# Patient Record
Sex: Male | Born: 1970 | Race: White | Hispanic: No | Marital: Single
Health system: Southern US, Community
[De-identification: ages and names within clinical notes are randomized; demographics above are authoritative.]

## PROBLEM LIST (undated history)

## (undated) DIAGNOSIS — E119 Type 2 diabetes mellitus without complications: Secondary | ICD-10-CM

---

## 2020-09-17 ENCOUNTER — Encounter (HOSPITAL_COMMUNITY): Payer: Self-pay | Admitting: Emergency Medicine

## 2020-09-17 ENCOUNTER — Emergency Department (HOSPITAL_COMMUNITY): Payer: PRIVATE HEALTH INSURANCE

## 2020-09-17 ENCOUNTER — Emergency Department (HOSPITAL_COMMUNITY)
Admission: EM | Admit: 2020-09-17 | Discharge: 2020-09-17 | Disposition: A | Discharge status: Home / Self Care | Payer: PRIVATE HEALTH INSURANCE | Attending: Emergency Medicine | Admitting: Emergency Medicine

## 2020-09-17 ENCOUNTER — Other Ambulatory Visit: Payer: Self-pay

## 2020-09-17 DIAGNOSIS — R000 Tachycardia, unspecified: Secondary | ICD-10-CM | POA: Insufficient documentation

## 2020-09-17 DIAGNOSIS — R600 Localized edema: Secondary | ICD-10-CM | POA: Insufficient documentation

## 2020-09-17 DIAGNOSIS — R0602 Shortness of breath: Secondary | ICD-10-CM | POA: Insufficient documentation

## 2020-09-17 DIAGNOSIS — E119 Type 2 diabetes mellitus without complications: Secondary | ICD-10-CM | POA: Diagnosis not present

## 2020-09-17 DIAGNOSIS — M25512 Pain in left shoulder: Secondary | ICD-10-CM | POA: Insufficient documentation

## 2020-09-17 HISTORY — DX: Type 2 diabetes mellitus without complications: E11.9

## 2020-09-17 LAB — D-DIMER, QUANTITATIVE: D-Dimer, Quant: <0.27 ug/mL-FEU (ref 0.00–0.50)

## 2020-09-17 LAB — CBC
HCT: 43.8 % (ref 39.0–52.0)
Hemoglobin: 15.2 g/dL (ref 13.0–17.0)
MCH: 31.7 pg (ref 26.0–34.0)
MCHC: 34.7 g/dL (ref 30.0–36.0)
MCV: 91.4 fL (ref 80.0–100.0)
Platelets: 225 10*3/uL (ref 150–400)
RBC: 4.79 MIL/uL (ref 4.22–5.81)
RDW: 12.2 % (ref 11.5–15.5)
WBC: 8.9 10*3/uL (ref 4.0–10.5)
nRBC: 0 % (ref 0.0–0.2)

## 2020-09-17 LAB — COMPREHENSIVE METABOLIC PANEL
ALT: 52 U/L — ABNORMAL HIGH (ref 0–44)
AST: 47 U/L — ABNORMAL HIGH (ref 15–41)
Albumin: 4 g/dL (ref 3.5–5.0)
Alkaline Phosphatase: 51 U/L (ref 38–126)
Anion gap: 8 (ref 5–15)
BUN: 15 mg/dL (ref 6–20)
CO2: 27 mmol/L (ref 22–32)
Calcium: 8.8 mg/dL — ABNORMAL LOW (ref 8.9–10.3)
Chloride: 99 mmol/L (ref 98–111)
Creatinine, Ser: 1.17 mg/dL (ref 0.61–1.24)
GFR, Estimated: >60 mL/min (ref >60)
Glucose, Bld: 150 mg/dL — ABNORMAL HIGH (ref 70–99)
Potassium: 3.6 mmol/L (ref 3.5–5.1)
Sodium: 134 mmol/L — ABNORMAL LOW (ref 135–145)
Total Bilirubin: 1.1 mg/dL (ref 0.3–1.2)
Total Protein: 7.9 g/dL (ref 6.5–8.1)

## 2020-09-17 LAB — BRAIN NATRIURETIC PEPTIDE: B Natriuretic Peptide: 11.4 pg/mL (ref 0.0–100.0)

## 2020-09-17 LAB — TROPONIN I (HIGH SENSITIVITY)
Troponin I (High Sensitivity): 3 ng/L (ref <18)
Troponin I (High Sensitivity): 2 ng/L (ref <18)

## 2020-09-17 NOTE — Discharge Instructions (Addendum)
Please see your doctor as soon as you are back home.

## 2020-09-17 NOTE — ED Notes (Signed)
X-ray at bedside

## 2020-09-17 NOTE — ED Triage Notes (Addendum)
Patient reports SOB, dizziness, and left shoulder/armpit pain which started approximately 3 hours ago. He also reports bilateral edema which he noticed is worse than usual and has not improved with lasix. He states he feels that he cannot catch his breath. He denies any sick exposures. Hx asthma, reports using rescue inhaler today (2 puffs) w/ no relief. Reports SOB worsens w/ exertion.

## 2020-09-17 NOTE — ED Provider Notes (Signed)
[facility]-EMERGENCY DEPT Provider Note   CSN: [id number] Arrival date & time: 09/17/20  1652     History Chief Complaint  Patient presents with  . Shortness of Breath    Gregory Douglas is a 50 y.o. male.  The history is provided by the patient.  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 No pertinent past medical history.  There are no problems to display for this patient.  History reviewed. No pertinent family history.     Home Medications Prior to Admission medications   Not on File    Allergies    Patient has no allergy information on record.  Review of Systems   Review of Systems  Constitutional: Negative for chills and fever.  HENT: Negative for ear pain and sore throat.   Eyes: Negative for pain and visual disturbance.  Respiratory: Positive for shortness of breath. Negative for cough and sputum production.   Cardiovascular: Negative for chest pain (Has had some deep left shoulder pain though.) and palpitations.  Gastrointestinal: Negative for abdominal pain and vomiting.  Genitourinary: Negative for dysuria and hematuria.  Musculoskeletal: Negative for arthralgias and back pain.  Skin: Negative for color change and rash.  Neurological: Negative for seizures and syncope.  All other systems reviewed and are negative.   Physical Exam Updated Vital Signs BP (!) 166/91   Pulse (!) 101   Temp 98.1 F (36.7 C)  (Oral)   Resp (!) 21   Ht 5\' 7"  (1.702 m)   Wt (!) 172.4 kg   SpO2 95%   BMI 59.52 kg/m   Physical Exam Vitals and nursing note reviewed.  Constitutional:      Appearance: He is well-developed.  HENT:     Head: Normocephalic and atraumatic.  Eyes:     Conjunctiva/sclera: Conjunctivae normal.  Cardiovascular:     Rate and Rhythm: Regular rhythm. Tachycardia present.     Heart sounds: No murmur heard.   Pulmonary:     Effort: Pulmonary effort is normal. No respiratory distress.     Breath sounds: Examination of the right-upper field reveals decreased breath sounds. Examination of the left-upper field reveals decreased breath sounds. Examination of the right-middle field reveals decreased breath sounds. Examination of the left-middle field reveals decreased breath sounds. Examination of the right-lower field reveals decreased breath sounds. Examination of the left-lower field reveals decreased breath sounds. Decreased breath sounds present.  Abdominal:     Palpations: Abdomen is soft.     Tenderness: There is no abdominal tenderness.  Musculoskeletal:     Cervical back: Neck supple.     Right lower leg: Edema present.     Left lower leg: Edema present.     Comments: Edema to the ankles bilaterally Hyperpigmented area at left ankle from prior skin infection  Skin:    General: Skin is warm and dry.     Capillary Refill: Capillary refill takes less than  2 seconds.  Neurological:     General: No focal deficit present.     Mental Status: He is alert.  Psychiatric:        Mood and Affect: Mood normal.        Behavior: Behavior normal.     ED Results / Procedures / Treatments   Labs (all labs ordered are listed, but only abnormal results are displayed) Labs Reviewed  COMPREHENSIVE METABOLIC PANEL - Abnormal; Notable for the following components:      Result Value   Sodium 134 (*)    Glucose, Bld 150 (*)    Calcium 8.8 (*)    AST 47 (*)    ALT 52 (*)    All other  components within normal limits  CBC  BRAIN NATRIURETIC PEPTIDE  D-DIMER, QUANTITATIVE (NOT AT [facility])  TROPONIN I (HIGH SENSITIVITY)  TROPONIN I (HIGH SENSITIVITY)    EKG None Sinus tachycardia, normal axis, no ischemia  Radiology DG Chest Port 1 View  Result Date: 09/17/2020 CLINICAL DATA:  Shortness of breath EXAM: PORTABLE CHEST 1 VIEW COMPARISON:  None. FINDINGS: The heart size and mediastinal contours are within normal limits. Both lungs are clear. The visualized skeletal structures are unremarkable. IMPRESSION: No active disease. Electronically Signed   By: Deatra Robinson M.D.   On: 09/17/2020 19:42    Procedures Procedures   Medications Ordered in ED Medications - No data to display  ED Course  I have reviewed the triage vital signs and the nursing notes.  Pertinent labs & imaging results that were available during my care of the patient were reviewed by me and considered in my medical decision making (see chart for details).    MDM Rules/Calculators/A&P                          [name] is 50 years old and has a history of diabetes.  He presents with shortness of breath and some left shoulder pain.  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  Here he was noted to be mildly hypertensive and slightly tachycardic at times.  ED evaluation was fairly normal.  Labs were obtained to evaluate for PE, ACS, CHF.  X-ray failed to reveal pulmonary edema or pneumonia.  He will be discharged home, and I did recommend he check in with his primary care doctor.  He also states that he plans to have a blood pressure cuff in his truck so he can monitor his blood pressure. Final Clinical Impression(s) / ED Diagnoses Final diagnoses:  Shortness of breath    Rx / DC Orders ED Discharge Orders    None       Koleen Distance, MD 09/17/20 2100

## 2022-05-29 IMAGING — DX DG CHEST 1V PORT
1 series · 1 of 1 positions shown · non-contrast
Comparison: None.

CLINICAL DATA: Shortness of breath

EXAM:
PORTABLE CHEST 1 VIEW

[chest ap]
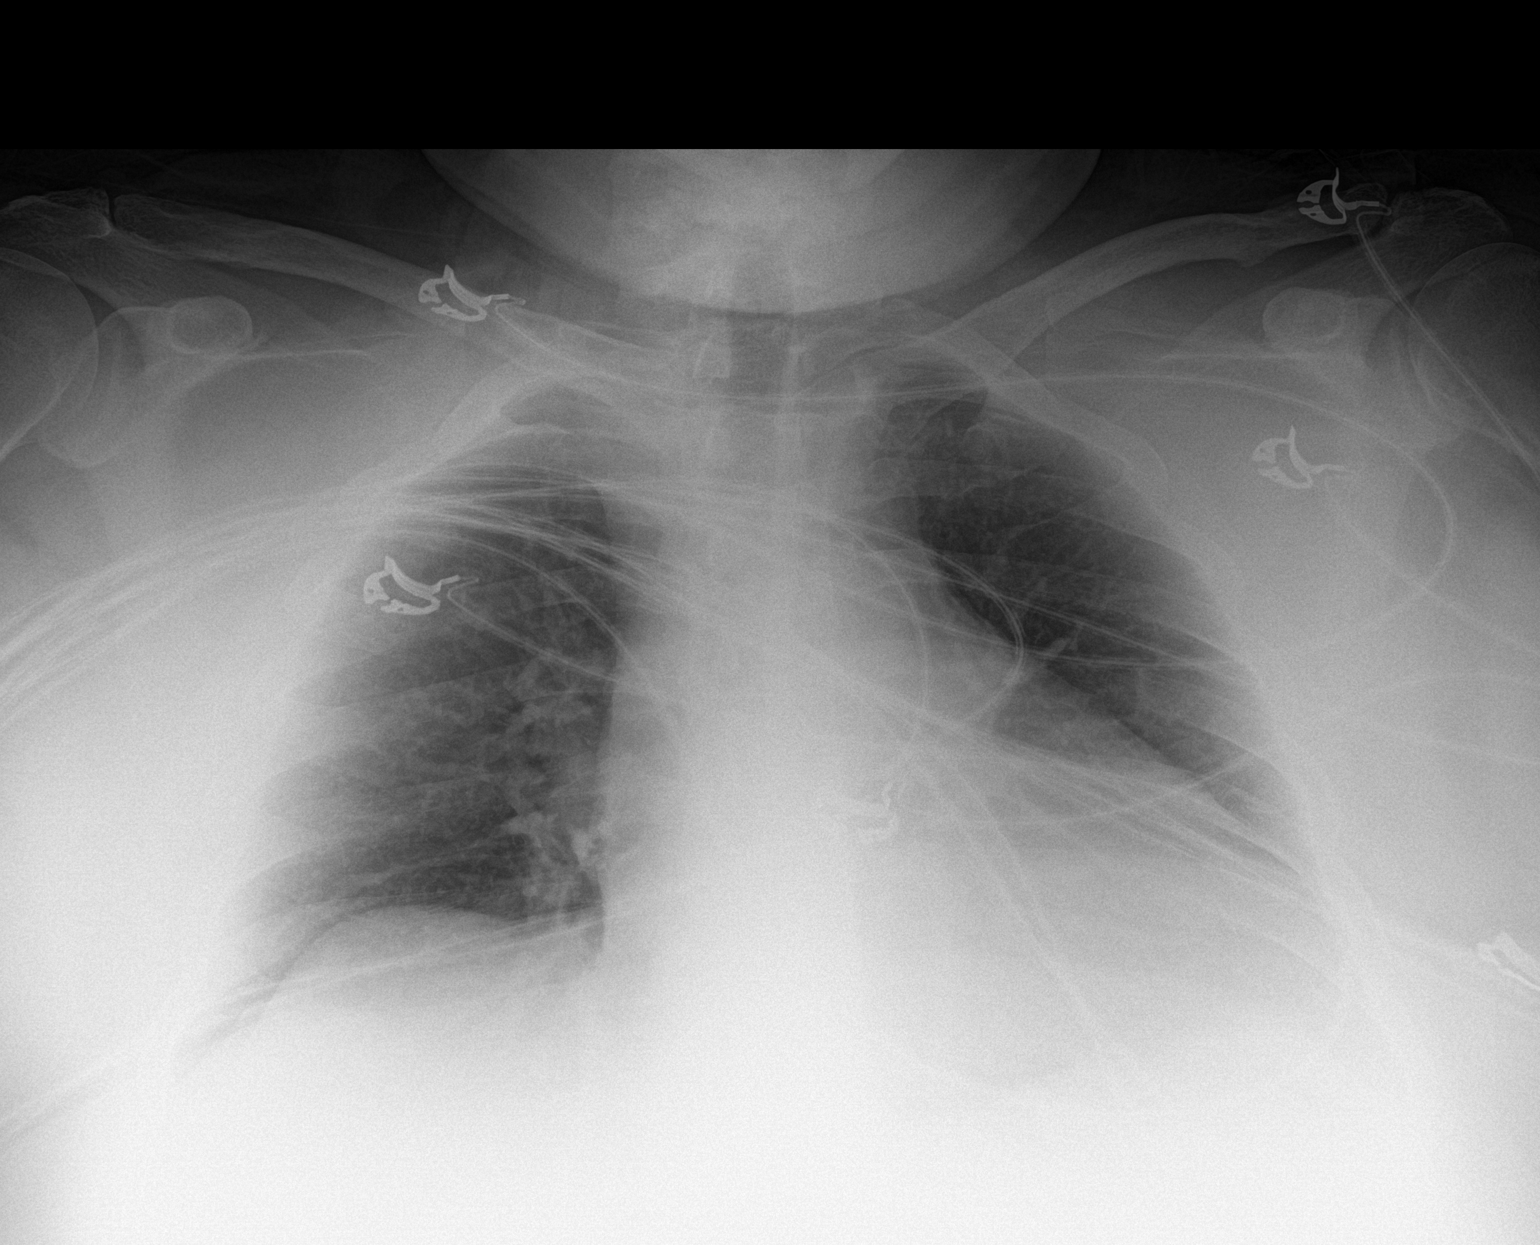

[1 of 1 positions shown; findings below may reference images not displayed]

FINDINGS: The heart size and mediastinal contours are within normal limits.
Both lungs are clear. The visualized skeletal structures are
unremarkable.
IMPRESSION: No active disease.
